# Patient Record
Sex: Female | Born: 1960 | Race: White | Hispanic: No | Marital: Married
Health system: Southern US, Community
[De-identification: ages and names within clinical notes are randomized; demographics above are authoritative.]

---

## 1997-11-27 ENCOUNTER — Other Ambulatory Visit: Admission: RE | Admit: 1997-11-27 | Discharge: 1997-11-27 | Payer: Self-pay | Admitting: *Deleted

## 1999-01-20 ENCOUNTER — Other Ambulatory Visit: Admission: RE | Admit: 1999-01-20 | Discharge: 1999-01-20 | Payer: Self-pay | Admitting: *Deleted

## 2002-03-04 ENCOUNTER — Other Ambulatory Visit: Admission: RE | Admit: 2002-03-04 | Discharge: 2002-03-04 | Payer: Self-pay | Admitting: Obstetrics and Gynecology

## 2003-03-19 ENCOUNTER — Other Ambulatory Visit: Admission: RE | Admit: 2003-03-19 | Discharge: 2003-03-19 | Payer: Self-pay | Admitting: Obstetrics and Gynecology

## 2008-02-10 ENCOUNTER — Emergency Department (HOSPITAL_COMMUNITY): Admission: EM | Admit: 2008-02-10 | Discharge: 2008-02-10 | Payer: Self-pay | Admitting: Emergency Medicine

## 2010-02-03 ENCOUNTER — Emergency Department (HOSPITAL_COMMUNITY): Admission: EM | Admit: 2010-02-03 | Discharge: 2010-02-03 | Payer: Self-pay | Admitting: Emergency Medicine

## 2021-02-26 ENCOUNTER — Emergency Department (HOSPITAL_BASED_OUTPATIENT_CLINIC_OR_DEPARTMENT_OTHER)
Admission: EM | Admit: 2021-02-26 | Discharge: 2021-02-26 | Disposition: A | Discharge status: Home / Self Care | Payer: Managed Care, Other (non HMO) | Attending: Emergency Medicine | Admitting: Emergency Medicine

## 2021-02-26 ENCOUNTER — Emergency Department (HOSPITAL_BASED_OUTPATIENT_CLINIC_OR_DEPARTMENT_OTHER): Payer: Managed Care, Other (non HMO) | Admitting: Radiology

## 2021-02-26 ENCOUNTER — Other Ambulatory Visit: Payer: Self-pay

## 2021-02-26 ENCOUNTER — Encounter (HOSPITAL_BASED_OUTPATIENT_CLINIC_OR_DEPARTMENT_OTHER): Payer: Self-pay | Admitting: Emergency Medicine

## 2021-02-26 DIAGNOSIS — S50311A Abrasion of right elbow, initial encounter: Secondary | ICD-10-CM | POA: Diagnosis not present

## 2021-02-26 DIAGNOSIS — Z23 Encounter for immunization: Secondary | ICD-10-CM | POA: Insufficient documentation

## 2021-02-26 DIAGNOSIS — S4991XA Unspecified injury of right shoulder and upper arm, initial encounter: Secondary | ICD-10-CM | POA: Diagnosis present

## 2021-02-26 DIAGNOSIS — S80211A Abrasion, right knee, initial encounter: Secondary | ICD-10-CM | POA: Insufficient documentation

## 2021-02-26 DIAGNOSIS — S42021A Displaced fracture of shaft of right clavicle, initial encounter for closed fracture: Secondary | ICD-10-CM | POA: Diagnosis not present

## 2021-02-26 DIAGNOSIS — Y929 Unspecified place or not applicable: Secondary | ICD-10-CM | POA: Insufficient documentation

## 2021-02-26 DIAGNOSIS — Y9355 Activity, bike riding: Secondary | ICD-10-CM | POA: Insufficient documentation

## 2021-02-26 DIAGNOSIS — Y999 Unspecified external cause status: Secondary | ICD-10-CM | POA: Insufficient documentation

## 2021-02-26 MED ORDER — ACETAMINOPHEN 325 MG PO TABS
650.0000 mg | ORAL_TABLET | Freq: Once | ORAL | Status: AC | PRN
  Administered 2021-02-26: 650 mg via ORAL
  Filled 2021-02-26: qty 2

## 2021-02-26 MED ORDER — TETANUS-DIPHTH-ACELL PERTUSSIS 5-2.5-18.5 LF-MCG/0.5 IM SUSY
0.5000 mL | PREFILLED_SYRINGE | Freq: Once | INTRAMUSCULAR | Status: AC
  Administered 2021-02-26: 0.5 mL via INTRAMUSCULAR
  Filled 2021-02-26: qty 0.5

## 2021-02-26 MED ORDER — HYDROCODONE-ACETAMINOPHEN 5-325 MG PO TABS: 1.0000 | ORAL_TABLET | Freq: Four times a day (QID) | ORAL | 0 refills | Status: AC | PRN
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
## 2021-02-26 NOTE — Discharge Instructions (Signed)
Please follow-up in clinic with your orthopedist for repeat evaluation. Keep your sling on. Norco has been sent to your pharmacy for pain control.
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
